# Patient Record
Sex: Male | Born: 2000 | Race: Black or African American | Hispanic: No | Marital: Single | Smoking: Never smoker
Health system: Southern US, Community
[De-identification: ages and names within clinical notes are randomized; demographics above are authoritative.]

## PROBLEM LIST (undated history)

## (undated) HISTORY — PX: MYRINGOTOMY: SHX2060

---
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
## 2022-05-08 DIAGNOSIS — M549 Dorsalgia, unspecified: Secondary | ICD-10-CM | POA: Diagnosis not present

## 2022-06-18 ENCOUNTER — Ambulatory Visit: Payer: Medicaid Other | Admitting: Nurse Practitioner

## 2022-07-07 DIAGNOSIS — J209 Acute bronchitis, unspecified: Secondary | ICD-10-CM | POA: Diagnosis not present

## 2022-07-07 DIAGNOSIS — Z1152 Encounter for screening for COVID-19: Secondary | ICD-10-CM | POA: Diagnosis not present

## 2023-03-14 ENCOUNTER — Ambulatory Visit: Payer: Medicaid Other | Admitting: Family Medicine

## 2024-03-10 ENCOUNTER — Other Ambulatory Visit: Payer: Self-pay

## 2024-03-10 ENCOUNTER — Emergency Department (HOSPITAL_COMMUNITY): Payer: Self-pay

## 2024-03-10 ENCOUNTER — Encounter (HOSPITAL_COMMUNITY): Payer: Self-pay

## 2024-03-10 ENCOUNTER — Emergency Department (HOSPITAL_COMMUNITY)
Admission: EM | Admit: 2024-03-10 | Discharge: 2024-03-10 | Disposition: A | Discharge status: Home / Self Care | Payer: Self-pay

## 2024-03-10 DIAGNOSIS — M545 Low back pain, unspecified: Secondary | ICD-10-CM

## 2024-03-10 DIAGNOSIS — M62830 Muscle spasm of back: Secondary | ICD-10-CM | POA: Insufficient documentation

## 2024-03-10 LAB — CBC WITH DIFFERENTIAL/PLATELET
Abs Immature Granulocytes: 0.01 K/uL (ref 0.00–0.07)
Basophils Absolute: 0 K/uL (ref 0.0–0.1)
Basophils Relative: 0 %
Eosinophils Absolute: 0 K/uL (ref 0.0–0.5)
Eosinophils Relative: 1 %
HCT: 46.5 % (ref 39.0–52.0)
Hemoglobin: 15.1 g/dL (ref 13.0–17.0)
Immature Granulocytes: 0 %
Lymphocytes Relative: 40 %
Lymphs Abs: 2.1 K/uL (ref 0.7–4.0)
MCH: 26.7 pg (ref 26.0–34.0)
MCHC: 32.5 g/dL (ref 30.0–36.0)
MCV: 82.2 fL (ref 80.0–100.0)
Monocytes Absolute: 0.4 K/uL (ref 0.1–1.0)
Monocytes Relative: 8 %
Neutro Abs: 2.7 K/uL (ref 1.7–7.7)
Neutrophils Relative %: 51 %
Platelets: 267 K/uL (ref 150–400)
RBC: 5.66 MIL/uL (ref 4.22–5.81)
RDW: 13.5 % (ref 11.5–15.5)
WBC: 5.2 K/uL (ref 4.0–10.5)
nRBC: 0 % (ref 0.0–0.2)

## 2024-03-10 LAB — URINALYSIS, ROUTINE W REFLEX MICROSCOPIC
Bilirubin Urine: NEGATIVE
Glucose, UA: NEGATIVE mg/dL
Hgb urine dipstick: NEGATIVE
Ketones, ur: NEGATIVE mg/dL
Leukocytes,Ua: NEGATIVE
Nitrite: NEGATIVE
Protein, ur: NEGATIVE mg/dL
Specific Gravity, Urine: 1.028 (ref 1.005–1.030)
pH: 7 (ref 5.0–8.0)

## 2024-03-10 LAB — COMPREHENSIVE METABOLIC PANEL WITH GFR
ALT: 39 U/L (ref 0–44)
AST: 35 U/L (ref 15–41)
Albumin: 4.2 g/dL (ref 3.5–5.0)
Alkaline Phosphatase: 62 U/L (ref 38–126)
Anion gap: 12 (ref 5–15)
BUN: 18 mg/dL (ref 6–20)
CO2: 23 mmol/L (ref 22–32)
Calcium: 9.2 mg/dL (ref 8.9–10.3)
Chloride: 101 mmol/L (ref 98–111)
Creatinine, Ser: 1.31 mg/dL — ABNORMAL HIGH (ref 0.61–1.24)
GFR, Estimated: >60 mL/min (ref >60)
Glucose, Bld: 93 mg/dL (ref 70–99)
Potassium: 3.9 mmol/L (ref 3.5–5.1)
Sodium: 136 mmol/L (ref 135–145)
Total Bilirubin: 0.8 mg/dL (ref 0.0–1.2)
Total Protein: 7.4 g/dL (ref 6.5–8.1)

## 2024-03-10 LAB — LIPASE, BLOOD: Lipase: 25 U/L (ref 11–51)

## 2024-03-10 MED ORDER — LIDOCAINE 5 % EX PTCH: 1.0000 | MEDICATED_PATCH | CUTANEOUS | 0 refills | Status: AC

## 2024-03-10 MED ORDER — IOHEXOL 300 MG/ML  SOLN
100.0000 mL | Freq: Once | INTRAMUSCULAR | Status: AC | PRN
  Administered 2024-03-10: 100 mL via INTRAVENOUS

## 2024-03-10 MED ORDER — CYCLOBENZAPRINE HCL 10 MG PO TABS: 10.0000 mg | ORAL_TABLET | Freq: Two times a day (BID) | ORAL | 0 refills | Status: AC | PRN

## 2024-03-10 MED ORDER — KETOROLAC TROMETHAMINE 15 MG/ML IJ SOLN
15.0000 mg | Freq: Once | INTRAMUSCULAR | Status: AC
  Administered 2024-03-10: 15 mg via INTRAVENOUS
  Filled 2024-03-10: qty 1

## 2024-03-10 NOTE — ED Provider Notes (Signed)
 [place] EMERGENCY DEPARTMENT AT [facility] Provider Note   CSN: [id number] Arrival date & time: 03/10/24  1701     Patient presents with: Back Pain   Cory Peters is a 23 y.o. male.    Back Pain    Presents because of back pain as well as abdominal pain.  Patient states that he was at a store today whenever forklift driver had a pallet on the forklift and subsequently patient was pinned between the forklift and his tailgate that was down.  Patient states since then, has been having some lower back pain as well as some mild abdominal pain.  Denies any chest pain or shortness of breath.  No neck pain.  No trauma to the neck or head.  Patient states that most of his pain is in his lower back.  Was able to ambulate after the incident.  No bowel bladder incontinence.  No saddle anesthesia.  Has not take anything for the pain.  No dysuria or hematuria.  Prior to Admission medications   Medication Sig Start Date End Date Taking? Authorizing Provider  cyclobenzaprine  (FLEXERIL ) 10 MG tablet Take 1 tablet (10 mg total) by mouth 2 (two) times daily as needed for muscle spasms. 03/10/24  Yes Simon Lavonia SAILOR, MD  lidocaine  (LIDODERM ) 5 % Place 1 patch onto the skin daily. Remove & Discard patch within 12 hours or as directed by MD 03/10/24  Yes Simon Lavonia SAILOR, MD    Allergies: Patient has no known allergies.    Review of Systems  Musculoskeletal:  Positive for back pain.    Updated Vital Signs BP (!) 112/58   Pulse (!) 56   Temp 98.1 F (36.7 C)   Resp 17   Ht 5' 9 (1.753 m)   Wt 83.9 kg   SpO2 100%   BMI 27.32 kg/m   Physical Exam Vitals and nursing note reviewed.  Constitutional:      General: He is not in acute distress.    Appearance: He is well-developed.  HENT:     Head: Normocephalic and atraumatic.  Eyes:     Conjunctiva/sclera: Conjunctivae normal.  Cardiovascular:     Rate and Rhythm: Normal rate and regular rhythm.     Heart sounds: No murmur  heard. Pulmonary:     Effort: Pulmonary effort is normal. No respiratory distress.     Breath sounds: Normal breath sounds.  Abdominal:     Palpations: Abdomen is soft.     Tenderness: There is no abdominal tenderness.  Musculoskeletal:        General: No swelling.       Arms:     Cervical back: Neck supple.  Skin:    General: Skin is warm and dry.     Capillary Refill: Capillary refill takes less than 2 seconds.  Neurological:     Mental Status: He is alert.  Psychiatric:        Mood and Affect: Mood normal.     (all labs ordered are listed, but only abnormal results are displayed) Labs Reviewed  COMPREHENSIVE METABOLIC PANEL WITH GFR - Abnormal; Notable for the following components:      Result Value   Creatinine, Ser 1.31 (*)    All other components within normal limits  CBC WITH DIFFERENTIAL/PLATELET  LIPASE, BLOOD  URINALYSIS, ROUTINE W REFLEX MICROSCOPIC    EKG: None  Radiology: CT L-SPINE NO CHARGE Result Date: 03/10/2024 CLINICAL DATA:  Back pain, crush injury EXAM: CT LUMBAR SPINE  WITHOUT CONTRAST TECHNIQUE: Multidetector CT imaging of the lumbar spine was performed without intravenous contrast administration. Multiplanar CT image reconstructions were also generated. RADIATION DOSE REDUCTION: This exam was performed according to the departmental dose-optimization program which includes automated exposure control, adjustment of the mA and/or kV according to patient size and/or use of iterative reconstruction technique. COMPARISON:  None Available. FINDINGS: Segmentation: 5 lumbar type vertebrae. Alignment: Normal. Vertebrae: No acute fracture or focal pathologic process. Paraspinal and other soft tissues: Negative. Disc levels: Intervertebral disc heights are preserved. No high-grade neuroforaminal narrowing. No significant canal stenosis. IMPRESSION: 1. No acute fracture or listhesis of the lumbar spine. Electronically Signed   By: Dorethia Molt M.D.   On: 03/10/2024  20:59   CT ABDOMEN PELVIS W CONTRAST Result Date: 03/10/2024 CLINICAL DATA:  Blunt abdominal trauma EXAM: CT ABDOMEN AND PELVIS WITH CONTRAST TECHNIQUE: Multidetector CT imaging of the abdomen and pelvis was performed using the standard protocol following bolus administration of intravenous contrast. RADIATION DOSE REDUCTION: This exam was performed according to the departmental dose-optimization program which includes automated exposure control, adjustment of the mA and/or kV according to patient size and/or use of iterative reconstruction technique. CONTRAST:  OMNIPAQUE  IOHEXOL  300 MG/ML  SOLN COMPARISON:  None available FINDINGS: Lower chest: No acute abnormality. Hepatobiliary: Mild hepatic steatosis. No enhancing intrahepatic mass. No intra or extrahepatic biliary ductal dilation. Gallbladder unremarkable. Pancreas: Unremarkable Spleen: Unremarkable Adrenals/Urinary Tract: Adrenal glands are unremarkable. Kidneys are normal, without renal calculi, focal lesion, or hydronephrosis. Bladder is unremarkable. Stomach/Bowel: Stomach is within normal limits. Appendix appears normal. No evidence of bowel wall thickening, distention, or inflammatory changes. Vascular/Lymphatic: No significant vascular findings are present. No enlarged abdominal or pelvic lymph nodes. Reproductive: Prostate is unremarkable. Other: Tiny fat containing umbilical hernia Musculoskeletal: No acute or significant osseous findings. IMPRESSION: 1. No acute intra-abdominal pathology identified. No definite radiographic explanation for the patient's reported symptoms. 2. Mild hepatic steatosis. Electronically Signed   By: Dorethia Molt M.D.   On: 03/10/2024 20:53   CT Thoracic Spine Wo Contrast Result Date: 03/10/2024 CLINICAL DATA:  Blunt thoracic trauma, crush injury EXAM: CT THORACIC SPINE WITHOUT CONTRAST TECHNIQUE: Multidetector CT images of the thoracic were obtained using the standard protocol without intravenous contrast.  RADIATION DOSE REDUCTION: This exam was performed according to the departmental dose-optimization program which includes automated exposure control, adjustment of the mA and/or kV according to patient size and/or use of iterative reconstruction technique. COMPARISON:  None Available. FINDINGS: Alignment: Normal. Vertebrae: No acute fracture or focal pathologic process. Paraspinal and other soft tissues: Negative. Disc levels: Intervertebral disc heights are preserved. Vertebral body heights are preserved. No high-grade canal stenosis. 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 Otherwise, no significant neuroforaminal narrowing. IMPRESSION: 1. No acute fracture or listhesis of the thoracic spine. 2. 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 Electronically Signed   By: Dorethia Molt M.D.   On: 03/10/2024 20:48     Procedures   Medications Ordered in the ED  ketorolac  (TORADOL ) 15 MG/ML injection 15 mg (15 mg Intravenous Given 03/10/24 1827)  iohexol  (OMNIPAQUE ) 300 MG/ML solution 100 mL (100 mLs Intravenous Contrast Given 03/10/24 1902)                                    Medical Decision Making Amount and/or Complexity of Data Reviewed Labs:  ordered. Radiology: ordered.  Risk Prescription drug management.      Presents because of back pain as well as abdominal pain.  Patient states that he was at a store today whenever forklift driver had a pallet on the forklift and subsequently patient was pinned between the forklift and his tailgate that was down.  Patient states since then, has been having some lower back pain as well as some mild abdominal pain.  Denies any chest pain or shortness of breath.  No neck pain.  No trauma to the neck or head.  Patient states that most of his pain is in his lower back.  Was able to ambulate after the incident.  No bowel bladder incontinence.  No saddle  anesthesia.  Has not take anything for the pain.  No dysuria or hematuria.   On exam, patient hemodynamically stable.  ANO x 3 GCS 15.  No focal deficits.  Cranials 2-12 intact.  Patient has some generalized thoracic and lumbar pain.  No significant pain to palpation of midline.  No bowel bladder incontinence.  No saddle anesthesia.  Neurologically intact in distal extremities.  Vascular intact distal extremities.  Strength intact with 5-5 strength both hip flexion and extension bilaterally.  Normal extension of flexion of the knees with equal and intact dorsal and plantarflexion.  Sensory exam unremarkable.  No concerns of any kind of central cord pathology such as cauda equina.   Given the mechanism, did obtain CT thoracic and lumbar as well as abdomen.  No obvious intra-abdominal pathology was seen.  No acute fracture was seen in the thoracic or lumbar vertebrae.  Patient remained neurologically intact   Patient was advised on Tylenol and ibuprofen .  Flexeril  prescription was added as well.  Patient will follow-up with physical therapy if needed.     Final diagnoses:  Acute low back pain without sciatica, unspecified back pain laterality  Muscle spasm of back    ED Discharge Orders          Ordered    lidocaine  (LIDODERM ) 5 %  Every 24 hours        03/10/24 2152    cyclobenzaprine  (FLEXERIL ) 10 MG tablet  2 times daily PRN        03/10/24 2152               Simon Lavonia SAILOR, MD 03/10/24 2155

## 2024-03-10 NOTE — Discharge Instructions (Addendum)
 For pain, you can take 1000 mg of Tylenol or 1 g of Tylenol every 6-8 hours.  Do not exceed more than 4000 mg or 4 g in a 24-hour period.  You can also take ibuprofen  600 to 800 mg every 6-8 hours as well.  Do not take this high-dose ibuprofen  for greater than a week.  You can use the lidocaine  patch as well.  You can also try the Flexeril  if you have a lot of pain at night.  Do not take this with other sedative medications.  Do not take this and drive.  If you continue have pain over the next couple days, I do think should follow-up with physical therapy.   If you have any kind of bowel or bladder incontinence, any numbness in your groin area or difficulty walking because of weakness then come back to the ED.

## 2024-03-10 NOTE — ED Triage Notes (Signed)
 Pt arrived via POV c/o thoracic and lumbar back pain that occurred around noon today. Pt reports he was accidentally crushed between his truck and a pallet on a forklift at [employer] in [place]. Pt denies numbness or tingling and is ambulatory in Triage.

## 2024-05-11 ENCOUNTER — Ambulatory Visit: Payer: Self-pay | Attending: Internal Medicine | Admitting: Physical Therapy
